# Patient Record
Sex: Male | Born: 1980 | Race: White | Hispanic: No | Marital: Single | Smoking: Current every day smoker
Health system: Southern US, Community
[De-identification: ages and names within clinical notes are randomized; demographics above are authoritative.]

## PROBLEM LIST (undated history)

## (undated) DIAGNOSIS — A6920 Lyme disease, unspecified: Secondary | ICD-10-CM

## (undated) DIAGNOSIS — K8020 Calculus of gallbladder without cholecystitis without obstruction: Secondary | ICD-10-CM

## (undated) DIAGNOSIS — F909 Attention-deficit hyperactivity disorder, unspecified type: Secondary | ICD-10-CM

## (undated) DIAGNOSIS — F1910 Other psychoactive substance abuse, uncomplicated: Secondary | ICD-10-CM

---

## 2018-02-23 ENCOUNTER — Encounter (HOSPITAL_COMMUNITY): Payer: Self-pay | Admitting: Emergency Medicine

## 2018-02-23 ENCOUNTER — Emergency Department (HOSPITAL_COMMUNITY): Payer: Self-pay

## 2018-02-23 ENCOUNTER — Other Ambulatory Visit: Payer: Self-pay

## 2018-02-23 ENCOUNTER — Emergency Department (HOSPITAL_COMMUNITY)
Admission: EM | Admit: 2018-02-23 | Discharge: 2018-02-23 | Disposition: A | Discharge status: Home / Self Care | Payer: Self-pay | Attending: Emergency Medicine | Admitting: Emergency Medicine

## 2018-02-23 DIAGNOSIS — F17210 Nicotine dependence, cigarettes, uncomplicated: Secondary | ICD-10-CM | POA: Insufficient documentation

## 2018-02-23 DIAGNOSIS — N201 Calculus of ureter: Secondary | ICD-10-CM | POA: Insufficient documentation

## 2018-02-23 DIAGNOSIS — Z79899 Other long term (current) drug therapy: Secondary | ICD-10-CM | POA: Insufficient documentation

## 2018-02-23 DIAGNOSIS — M545 Low back pain: Secondary | ICD-10-CM | POA: Insufficient documentation

## 2018-02-23 DIAGNOSIS — R112 Nausea with vomiting, unspecified: Secondary | ICD-10-CM | POA: Insufficient documentation

## 2018-02-23 HISTORY — DX: Lyme disease, unspecified: A69.20

## 2018-02-23 HISTORY — DX: Calculus of gallbladder without cholecystitis without obstruction: K80.20

## 2018-02-23 HISTORY — DX: Other psychoactive substance abuse, uncomplicated: F19.10

## 2018-02-23 HISTORY — DX: Attention-deficit hyperactivity disorder, unspecified type: F90.9

## 2018-02-23 LAB — COMPREHENSIVE METABOLIC PANEL
ALBUMIN: 4 g/dL (ref 3.5–5.0)
ALK PHOS: 84 U/L (ref 38–126)
ALT: 40 U/L (ref 0–44)
AST: 25 U/L (ref 15–41)
Anion gap: 9 (ref 5–15)
BILIRUBIN TOTAL: 0.3 mg/dL (ref 0.3–1.2)
BUN: 14 mg/dL (ref 6–20)
CHLORIDE: 105 mmol/L (ref 98–111)
CO2: 28 mmol/L (ref 22–32)
CREATININE: 1.09 mg/dL (ref 0.61–1.24)
Calcium: 8.7 mg/dL — ABNORMAL LOW (ref 8.9–10.3)
GFR calc Af Amer: >60 mL/min (ref >60)
GLUCOSE: 109 mg/dL — AB (ref 70–99)
Potassium: 3.8 mmol/L (ref 3.5–5.1)
SODIUM: 142 mmol/L (ref 135–145)
Total Protein: 7.4 g/dL (ref 6.5–8.1)

## 2018-02-23 LAB — CBC
HEMATOCRIT: 43.8 % (ref 39.0–52.0)
Hemoglobin: 14.7 g/dL (ref 13.0–17.0)
MCH: 28.9 pg (ref 26.0–34.0)
MCHC: 33.6 g/dL (ref 30.0–36.0)
MCV: 86.1 fL (ref 78.0–100.0)
PLATELETS: 304 10*3/uL (ref 150–400)
RBC: 5.09 MIL/uL (ref 4.22–5.81)
RDW: 13.5 % (ref 11.5–15.5)
WBC: 10.9 10*3/uL — ABNORMAL HIGH (ref 4.0–10.5)

## 2018-02-23 LAB — URINALYSIS, ROUTINE W REFLEX MICROSCOPIC
BACTERIA UA: NONE SEEN
Bilirubin Urine: NEGATIVE
Glucose, UA: NEGATIVE mg/dL
KETONES UR: NEGATIVE mg/dL
Leukocytes, UA: NEGATIVE
Nitrite: NEGATIVE
PH: 5 (ref 5.0–8.0)
Protein, ur: NEGATIVE mg/dL
SPECIFIC GRAVITY, URINE: 1.017 (ref 1.005–1.030)

## 2018-02-23 LAB — LIPASE, BLOOD: Lipase: 37 U/L (ref 11–51)

## 2018-02-23 MED ORDER — IBUPROFEN 800 MG PO TABS: 800.0000 mg | ORAL_TABLET | Freq: Three times a day (TID) | ORAL | 0 refills | Status: AC | PRN

## 2018-02-23 MED ORDER — ONDANSETRON HCL 4 MG PO TABS: 4.0000 mg | ORAL_TABLET | Freq: Four times a day (QID) | ORAL | 0 refills | Status: AC

## 2018-02-23 MED ORDER — TAMSULOSIN HCL 0.4 MG PO CAPS: 0.4000 mg | ORAL_CAPSULE | Freq: Every day | ORAL | 0 refills | Status: AC

## 2018-02-23 NOTE — ED Notes (Signed)
Patient transported to CT 

## 2018-02-23 NOTE — Discharge Instructions (Addendum)
Take Motrin as prescribed as needed for pain.  Take Zofran as needed as prescribed for nausea and vomiting.  Follow-up with urology, return to ER for worsening or concerning symptoms.  Strain all urine.

## 2018-02-23 NOTE — ED Provider Notes (Signed)
[facility]-EMERGENCY DEPT Provider Note   CSN: [id number] Arrival date & time: 02/23/18  1818     History   Chief Complaint Chief Complaint  Patient presents with  . Flank Pain  . Abdominal Pain    HPI Miguel Sanchez is a 37 y.o. male.  37 year old male brought in by EMS for right lower quadrant and flank pain.  Patient states proximal me 20 minutes prior to arrival he developed sudden onset of stabbing pain in his right lower back through to his right lower quadrant area.  Patient reports nausea and vomiting with the onset of his pain.  States the past 2 weeks he had darker than usual urine otherwise denies dysuria or frequency, changes in bowel habits.  No history of similar pain previously.  Pain has improved although still present at this time.  No other complaints or concerns.     Past Medical History:  Diagnosis Date  . ADHD   . Gall stones   . Lyme disease   . Substance abuse (HCC)     There are no active problems to display for this patient.   History reviewed. No pertinent surgical history.      Home Medications    Prior to Admission medications   Medication Sig Start Date End Date Taking? Authorizing Provider  estradiol (ESTRACE) 2 MG tablet Take 2 mg by mouth 3 (three) times daily.   Yes [provider]  ibuprofen (ADVIL,MOTRIN) 800 MG tablet Take 1 tablet (800 mg total) by mouth every 8 (eight) hours as needed for up to 30 doses for moderate pain. 02/23/18   Jeannie Fend, PA-C  ondansetron (ZOFRAN) 4 MG tablet Take 1 tablet (4 mg total) by mouth every 6 (six) hours. 02/23/18   Jeannie Fend, PA-C  tamsulosin (FLOMAX) 0.4 MG CAPS capsule Take 1 capsule (0.4 mg total) by mouth daily for 10 days. 02/23/18 03/05/18  Jeannie Fend, PA-C    Family History No family history on file.  Social History Social History   Tobacco Use  . Smoking status: Current Every Day Smoker    Types: Cigarettes  . Smokeless tobacco: Never Used    Substance Use Topics  . Alcohol use: Yes  . Drug use: Not on file     Allergies   Sulfa antibiotics and Doxycycline   Review of Systems Review of Systems  Constitutional: Negative for chills and fever.  Respiratory: Negative for shortness of breath.   Cardiovascular: Negative for chest pain.  Gastrointestinal: Positive for abdominal pain, nausea and vomiting. Negative for constipation and diarrhea.  Genitourinary: Negative for difficulty urinating, scrotal swelling and testicular pain.  Musculoskeletal: Positive for back pain.  Skin: Negative for rash and wound.  Allergic/Immunologic: Negative for immunocompromised state.  Hematological: Does not bruise/bleed easily.  Psychiatric/Behavioral: Negative for confusion.  All other systems reviewed and are negative.    Physical Exam Updated Vital Signs BP 109/62 (BP Location: Left Arm)   Pulse 76   Temp 97.9 F (36.6 C) (Oral)   Resp 17   Ht 6\' 2"  (1.88 m)   Wt 74.8 kg   SpO2 99%   BMI 21.18 kg/m   Physical Exam  Constitutional: He is oriented to person, place, and time. He appears well-developed and well-nourished.  Non-toxic appearance. He does not appear ill. No distress.  HENT:  Head: Normocephalic and atraumatic.  Cardiovascular: Normal rate, regular rhythm and normal heart sounds.  Pulmonary/Chest: Effort normal and breath sounds normal.  Abdominal:  Normal appearance. There is generalized tenderness and tenderness in the right upper quadrant and right lower quadrant. There is no CVA tenderness.  Neurological: He is alert and oriented to person, place, and time.  Skin: Skin is warm and dry. He is not diaphoretic.  Psychiatric: He has a normal mood and affect. His behavior is normal.  Nursing note and vitals reviewed.    ED Treatments / Results  Labs (all labs ordered are listed, but only abnormal results are displayed) Labs Reviewed  COMPREHENSIVE METABOLIC PANEL - Abnormal; Notable for the following  components:      Result Value   Glucose, Bld 109 (*)    Calcium 8.7 (*)    All other components within normal limits  CBC - Abnormal; Notable for the following components:   WBC 10.9 (*)    All other components within normal limits  URINALYSIS, ROUTINE W REFLEX MICROSCOPIC - Abnormal; Notable for the following components:   Hgb urine dipstick SMALL (*)    All other components within normal limits  LIPASE, BLOOD    EKG None  Radiology Ct Renal Stone Study  Result Date: 02/23/2018 CLINICAL DATA:  Flank pain EXAM: CT ABDOMEN AND PELVIS WITHOUT CONTRAST TECHNIQUE: Multidetector CT imaging of the abdomen and pelvis was performed following the standard protocol without IV contrast. COMPARISON:  None. FINDINGS: Lower chest: Lung bases are clear. No effusions. Heart is normal size. Hepatobiliary: Mild fatty infiltration of the liver. No focal hepatic abnormality. Gallbladder unremarkable. Pancreas: No focal abnormality or ductal dilatation. Spleen: No focal abnormality.  Normal size. Adrenals/Urinary Tract: Bilateral nonobstructing renal stones. Mild right hydronephrosis. 5 mm distal right ureteral stone. Adrenal glands and urinary bladder unremarkable. Stomach/Bowel: Appendix is normal. Stomach, large and small bowel grossly unremarkable. Vascular/Lymphatic: No evidence of aneurysm or adenopathy. Reproductive: No visible focal abnormality. Other: No free fluid or free air. Musculoskeletal: No acute bony abnormality. IMPRESSION: 5 mm right UVJ stone with mild right hydronephrosis. Bilateral punctate nephrolithiasis. Mild fatty infiltration of the liver. Electronically Signed   By: Charlett NoseKevin  Dover M.D.   On: 02/23/2018 20:09    Procedures Procedures (including critical care time)  Medications Ordered in ED Medications - No data to display   Initial Impression / Assessment and Plan / ED Course  I have reviewed the triage vital signs and the nursing notes.  Pertinent labs & imaging results that  were available during my care of the patient were reviewed by me and considered in my medical decision making (see chart for details).  Clinical Course as of Feb 23 2042  Thu Feb 23, 2018  [id number] 37 year old male presents with complaint of sudden onset right low back pain with nausea and vomiting.  On exam no CVA tenderness, mild tenderness in the right lower quadrant.  Review of lab work patient is a small amount of hemoglobin in his urine, CMP unremarkable, lipase normal, CBC with mild leukocytosis. CT shows 5mm right UVJ stone. Patient is comfortable without further pain medication, ready for dc. Declines narcotic pain medications, given zofran, motrin and flomax. Follow up with urology, return to ER as needed, strain all urine.    [LM]    Clinical Course User Index [LM] Jeannie FendMurphy, Rashena Dowling A, PA-C     Final Clinical Impressions(s) / ED Diagnoses   Final diagnoses:  Right ureteral stone    ED Discharge Orders         Ordered    ondansetron (ZOFRAN) 4 MG tablet  Every 6 hours  02/23/18 2032    tamsulosin (FLOMAX) 0.4 MG CAPS capsule  Daily     02/23/18 2032    ibuprofen (ADVIL,MOTRIN) 800 MG tablet  Every 8 hours PRN     02/23/18 2032           Jeannie Fend, PA-C 02/23/18 2043    Samuel Jester, DO 02/23/18 2302

## 2018-02-23 NOTE — ED Notes (Signed)
Bed: WA14 Expected date:  Expected time:  Means of arrival:  Comments: EMS/abd. pain 

## 2018-02-23 NOTE — ED Notes (Signed)
Provided Pt with strainer before departure

## 2018-02-23 NOTE — ED Notes (Addendum)
Sent urine culture to lab with urine sample

## 2018-02-23 NOTE — ED Triage Notes (Signed)
Per [facility] pt had sudden severe right flank pain that radiates to right abd. Pt reports feels like needs to urinate but was painful. Pt diaphoretic with EMS in route.  Reports had darker red urine 2 weeks ago but none today.  Pt request to not have narcotics.  Vitals: 154/90 100HR, 26R, CBG 84.

## 2018-02-23 NOTE — ED Notes (Signed)
Pt given urinal.

## 2018-02-23 NOTE — ED Notes (Signed)
ED Provider at bedside. 

## 2018-02-25 ENCOUNTER — Encounter (HOSPITAL_COMMUNITY): Payer: Self-pay

## 2018-02-25 ENCOUNTER — Other Ambulatory Visit: Payer: Self-pay

## 2018-02-25 ENCOUNTER — Emergency Department (HOSPITAL_COMMUNITY)
Admission: EM | Admit: 2018-02-25 | Discharge: 2018-02-25 | Disposition: A | Discharge status: Home / Self Care | Payer: Self-pay | Attending: Emergency Medicine | Admitting: Emergency Medicine

## 2018-02-25 DIAGNOSIS — N200 Calculus of kidney: Secondary | ICD-10-CM | POA: Insufficient documentation

## 2018-02-25 DIAGNOSIS — Z79899 Other long term (current) drug therapy: Secondary | ICD-10-CM | POA: Insufficient documentation

## 2018-02-25 DIAGNOSIS — F17210 Nicotine dependence, cigarettes, uncomplicated: Secondary | ICD-10-CM | POA: Insufficient documentation

## 2018-02-25 MED ORDER — HYDROMORPHONE HCL 2 MG PO TABS: 2.0000 mg | ORAL_TABLET | ORAL | Status: DC | PRN

## 2018-02-25 MED ORDER — HYDROMORPHONE HCL 1 MG/ML IJ SOLN
1.0000 mg | Freq: Once | INTRAMUSCULAR | Status: AC
  Administered 2018-02-25: 1 mg via INTRAVENOUS
  Filled 2018-02-25: qty 1

## 2018-02-25 NOTE — ED Triage Notes (Signed)
Pt states that they were recently dx with kidney stones. Pt states they took oxy 10 mg at 1130. Pt having right flank pain.

## 2018-02-25 NOTE — ED Provider Notes (Addendum)
[facility]-EMERGENCY DEPT Provider Note  CSN: [id number] Arrival date & time: 02/25/18  1334  History   Chief Complaint Chief Complaint  Patient presents with  . Flank Pain    HPI Miguel Sanchez (prefers to go by Miguel Sanchez) is a 37 y.o. transitioning male to male with a medical history of nephrolithiasis who presented to the ED for pain associated with nephrolithiasis. Patient states that she was diagnosed with right kidney stone at his ED visit on 02/23/18. Patient reports that pain has been manageable with ibuprofen and has been compliant with Flomax. However, she states that this morning that the pain acutely worsened and has not alleviated at all. Currently endorses 10/10 right flank pain that radiates to his testicles. Associated symptoms: sweating, nausea and vomiting. Denies fever, chills, abdominal pain, hematuria, dysuria, frequency or urgency.  Additional history obtained from medical chart. CT renal stone study on 02/23/18 showed a 5mm right nephrolithiasis in the UVJ.  Past Medical History:  Diagnosis Date  . ADHD   . Gall stones   . Lyme disease   . Substance abuse (HCC)     There are no active problems to display for this patient.   History reviewed. No pertinent surgical history.    Home Medications    Prior to Admission medications   Medication Sig Start Date End Date Taking? Authorizing Provider  estradiol (ESTRACE) 2 MG tablet Take 2 mg by mouth 3 (three) times daily.   Yes [provider]  HYDROcodone-acetaminophen (NORCO) 10-325 MG tablet Take 1 tablet by mouth every 6 (six) hours as needed for moderate pain or severe pain.   Yes [provider]  tamsulosin (FLOMAX) 0.4 MG CAPS capsule Take 1 capsule (0.4 mg total) by mouth daily for 10 days. 02/23/18 03/05/18 Yes Jeannie Fend, PA-C  ibuprofen (ADVIL,MOTRIN) 800 MG tablet Take 1 tablet (800 mg total) by mouth every 8 (eight) hours as needed for up to 30 doses for moderate  pain. 02/23/18   Jeannie Fend, PA-C  ondansetron (ZOFRAN) 4 MG tablet Take 1 tablet (4 mg total) by mouth every 6 (six) hours. 02/23/18   Jeannie Fend, PA-C    Family History No family history on file.  Social History Social History   Tobacco Use  . Smoking status: Current Every Day Smoker    Packs/day: 0.50    Types: Cigarettes  . Smokeless tobacco: Never Used  Substance Use Topics  . Alcohol use: Yes    Comment: sparsely  . Drug use: Yes    Types: Marijuana     Allergies   Sulfa antibiotics and Doxycycline   Review of Systems Review of Systems  Constitutional: Positive for diaphoresis. Negative for chills and fever.  Gastrointestinal: Positive for nausea and vomiting. Negative for abdominal pain, constipation and diarrhea.  Genitourinary: Positive for flank pain and testicular pain. Negative for dysuria and hematuria.  Skin: Negative.   Neurological: Negative.    Physical Exam Updated Vital Signs BP 104/65   Pulse 78   Temp (!) 97.4 F (36.3 C) (Oral)   Resp 17   Ht 6\' 2"  (1.88 m)   Wt 77.1 kg   SpO2 95%   BMI 21.83 kg/m   Physical Exam  Constitutional:  Leaning over the hospital bed in pain. Shouting in pain and sweating.  Cardiovascular: Normal rate, regular rhythm and normal heart sounds.  Pulmonary/Chest: Effort normal and breath sounds normal.  Abdominal: Soft. Bowel sounds are normal. There is no tenderness. There  is CVA tenderness.  Musculoskeletal: Normal range of motion.  Skin: Skin is warm. Capillary refill takes less than 2 seconds. He is diaphoretic. No erythema.  Nursing note and vitals reviewed.  ED Treatments / Results  Labs (all labs ordered are listed, but only abnormal results are displayed) Labs Reviewed  URINALYSIS, ROUTINE W REFLEX MICROSCOPIC    EKG None  Radiology Ct Renal Stone Study  Result Date: 02/23/2018 CLINICAL DATA:  Flank pain EXAM: CT ABDOMEN AND PELVIS WITHOUT CONTRAST TECHNIQUE: Multidetector CT imaging of  the abdomen and pelvis was performed following the standard protocol without IV contrast. COMPARISON:  None. FINDINGS: Lower chest: Lung bases are clear. No effusions. Heart is normal size. Hepatobiliary: Mild fatty infiltration of the liver. No focal hepatic abnormality. Gallbladder unremarkable. Pancreas: No focal abnormality or ductal dilatation. Spleen: No focal abnormality.  Normal size. Adrenals/Urinary Tract: Bilateral nonobstructing renal stones. Mild right hydronephrosis. 5 mm distal right ureteral stone. Adrenal glands and urinary bladder unremarkable. Stomach/Bowel: Appendix is normal. Stomach, large and small bowel grossly unremarkable. Vascular/Lymphatic: No evidence of aneurysm or adenopathy. Reproductive: No visible focal abnormality. Other: No free fluid or free air. Musculoskeletal: No acute bony abnormality. IMPRESSION: 5 mm right UVJ stone with mild right hydronephrosis. Bilateral punctate nephrolithiasis. Mild fatty infiltration of the liver. Electronically Signed   By: Charlett Nose M.D.   On: 02/23/2018 20:09    Procedures Procedures (including critical care time)  Medications Ordered in ED Medications  HYDROmorphone (DILAUDID) tablet 2 mg (has no administration in time range)  HYDROmorphone (DILAUDID) injection 1 mg (1 mg Intravenous Given 02/25/18 1428)  HYDROmorphone (DILAUDID) injection 1 mg (1 mg Intravenous Given 02/25/18 1521)     Initial Impression / Assessment and Plan / ED Course  Triage vital signs and the nursing notes have been reviewed.  Pertinent labs & imaging results that were available during care of the patient were reviewed and considered in medical decision making (see chart for details).  Patient presents to the ED with severe right flank pain. She is found leaning horizontally over the bed and is sweating profusely due to pain and states that position is the only comfortable position for him. Patient has no other complaints to suggest an acute or emergent  intra-abdominal or GU process. Patient seen by urology yesterday 02/24/18 and was prescribed Oxycodone for pain control which did not help with the acute pain today due to passing of nephrolithiasis. Will administer IV pain medications for acute pain control.  Clinical Course as of Feb 25 1713  Sat Feb 25, 2018  1624 Patient re-evaluated after IV Dilaudid 2mg  given and patient reports significant relief. Repeat physical exam is grossly unremarkable except for right CVA tenderness which is expected given diagnosis.   [GM]    Clinical Course User Index [GM] Bryttany Tortorelli, Sharyon Medicus, PA-C    Final Clinical Impressions(s) / ED Diagnoses  1. Nephrolithiasis. Education provided on pharmacologic and supportive treatment for pain relief. Advised to follow-up with urology as scheduled and continue taking Flomax.  Dispo: Home. After thorough clinical evaluation, this patient is determined to be medically stable and can be safely discharged with the previously mentioned treatment and/or outpatient follow-up/referral(s). At this time, there are no other apparent medical conditions that require further screening, evaluation or treatment.  Final diagnoses:  Nephrolithiasis    ED Discharge Orders    None        Windy Carina, PA-C 02/25/18 [address] [place], [place] 02/25/18 1714  Wynetta FinesMessick, Peter C, MD 02/27/18 [phone]

## 2018-02-25 NOTE — Discharge Instructions (Addendum)
I hope the worst of the pain is over for you.   Continue to take the Flomax as prescribed. You may take Tylenol (up to 650mg ) with the Oxycodone prescribed by urology. To stay ahead of the pain, I would at least take the Tylenol or Ibuprofen on a schedule for the next 2-3 days. Be sure to strain your urine every time you go to the bathroom to check to see if the kidney stone is passed and so you can take it to urology.  Thank you for allowing me to take care of you today!  I have provided you with more information on diet changes you can make. Thank you for allowing me to take care of you today!

## 2018-02-25 NOTE — ED Notes (Signed)
Pt goes by [employer] Marcine Sanchez([name])

## 2018-03-03 ENCOUNTER — Emergency Department (HOSPITAL_COMMUNITY)
Admission: EM | Admit: 2018-03-03 | Discharge: 2018-03-03 | Disposition: A | Discharge status: Home / Self Care | Payer: Self-pay | Attending: Emergency Medicine | Admitting: Emergency Medicine

## 2018-03-03 ENCOUNTER — Emergency Department (HOSPITAL_COMMUNITY): Payer: Self-pay

## 2018-03-03 ENCOUNTER — Other Ambulatory Visit: Payer: Self-pay

## 2018-03-03 ENCOUNTER — Encounter (HOSPITAL_COMMUNITY): Payer: Self-pay | Admitting: Emergency Medicine

## 2018-03-03 DIAGNOSIS — N134 Hydroureter: Secondary | ICD-10-CM | POA: Insufficient documentation

## 2018-03-03 DIAGNOSIS — F17210 Nicotine dependence, cigarettes, uncomplicated: Secondary | ICD-10-CM | POA: Insufficient documentation

## 2018-03-03 DIAGNOSIS — R109 Unspecified abdominal pain: Secondary | ICD-10-CM

## 2018-03-03 DIAGNOSIS — N201 Calculus of ureter: Secondary | ICD-10-CM

## 2018-03-03 DIAGNOSIS — F909 Attention-deficit hyperactivity disorder, unspecified type: Secondary | ICD-10-CM | POA: Insufficient documentation

## 2018-03-03 DIAGNOSIS — Z79899 Other long term (current) drug therapy: Secondary | ICD-10-CM | POA: Insufficient documentation

## 2018-03-03 DIAGNOSIS — F1290 Cannabis use, unspecified, uncomplicated: Secondary | ICD-10-CM | POA: Insufficient documentation

## 2018-03-03 DIAGNOSIS — N132 Hydronephrosis with renal and ureteral calculous obstruction: Secondary | ICD-10-CM | POA: Insufficient documentation

## 2018-03-03 DIAGNOSIS — R112 Nausea with vomiting, unspecified: Secondary | ICD-10-CM | POA: Insufficient documentation

## 2018-03-03 LAB — URINALYSIS, ROUTINE W REFLEX MICROSCOPIC
Bilirubin Urine: NEGATIVE
GLUCOSE, UA: NEGATIVE mg/dL
Ketones, ur: 5 mg/dL — AB
Leukocytes, UA: NEGATIVE
NITRITE: NEGATIVE
PH: 5 (ref 5.0–8.0)
Protein, ur: 30 mg/dL — AB
Specific Gravity, Urine: 1.015 (ref 1.005–1.030)

## 2018-03-03 LAB — BASIC METABOLIC PANEL
Anion gap: 13 (ref 5–15)
BUN: 20 mg/dL (ref 6–20)
CO2: 26 mmol/L (ref 22–32)
CREATININE: 1.84 mg/dL — AB (ref 0.61–1.24)
Calcium: 9 mg/dL (ref 8.9–10.3)
Chloride: 100 mmol/L (ref 98–111)
GFR, EST AFRICAN AMERICAN: 53 mL/min — AB (ref >60)
GFR, EST NON AFRICAN AMERICAN: 46 mL/min — AB (ref >60)
Glucose, Bld: 103 mg/dL — ABNORMAL HIGH (ref 70–99)
Potassium: 3.9 mmol/L (ref 3.5–5.1)
SODIUM: 139 mmol/L (ref 135–145)

## 2018-03-03 LAB — CBC WITH DIFFERENTIAL/PLATELET
Basophils Absolute: 0.1 10*3/uL (ref 0.0–0.1)
Basophils Relative: 0 %
EOS ABS: 0.4 10*3/uL (ref 0.0–0.7)
EOS PCT: 3 %
HCT: 40.5 % (ref 39.0–52.0)
Hemoglobin: 13.8 g/dL (ref 13.0–17.0)
Lymphocytes Relative: 18 %
Lymphs Abs: 2.4 10*3/uL (ref 0.7–4.0)
MCH: 29.5 pg (ref 26.0–34.0)
MCHC: 34.1 g/dL (ref 30.0–36.0)
MCV: 86.5 fL (ref 78.0–100.0)
MONO ABS: 1.1 10*3/uL — AB (ref 0.1–1.0)
MONOS PCT: 8 %
Neutro Abs: 9.8 10*3/uL — ABNORMAL HIGH (ref 1.7–7.7)
Neutrophils Relative %: 71 %
PLATELETS: 292 10*3/uL (ref 150–400)
RBC: 4.68 MIL/uL (ref 4.22–5.81)
RDW: 13.6 % (ref 11.5–15.5)
WBC: 13.8 10*3/uL — ABNORMAL HIGH (ref 4.0–10.5)

## 2018-03-03 MED ORDER — OXYCODONE-ACETAMINOPHEN 5-325 MG PO TABS: 1.0000 | ORAL_TABLET | ORAL | 0 refills | Status: AC | PRN

## 2018-03-03 MED ORDER — HYDROMORPHONE HCL 1 MG/ML IJ SOLN
1.0000 mg | Freq: Once | INTRAMUSCULAR | Status: AC
  Administered 2018-03-03: 1 mg via INTRAVENOUS
  Filled 2018-03-03: qty 1

## 2018-03-03 MED ORDER — SODIUM CHLORIDE 0.9 % IV BOLUS
500.0000 mL | Freq: Once | INTRAVENOUS | Status: AC
  Administered 2018-03-03: 500 mL via INTRAVENOUS

## 2018-03-03 MED ORDER — ONDANSETRON HCL 4 MG/2ML IJ SOLN
4.0000 mg | Freq: Once | INTRAMUSCULAR | Status: AC
  Administered 2018-03-03: 4 mg via INTRAVENOUS
  Filled 2018-03-03: qty 2

## 2018-03-03 MED ORDER — IBUPROFEN 200 MG PO TABS
600.0000 mg | ORAL_TABLET | Freq: Once | ORAL | Status: AC
  Administered 2018-03-03: 600 mg via ORAL
  Filled 2018-03-03: qty 3

## 2018-03-03 NOTE — ED Notes (Signed)
Pt states preferred name is "Miguel Sanchez" (pronounced Caylin)

## 2018-03-03 NOTE — Discharge Instructions (Addendum)
Continue your medications as prescribed. Follow-up with Dr. Vernie Ammons next week as scheduled. Return here for any new/acute changes-- uncontrolled pain, fevers, inability to urinate, etc.

## 2018-03-03 NOTE — ED Notes (Signed)
Patient given ginger ale for po challenge, per provider.

## 2018-03-03 NOTE — ED Triage Notes (Signed)
Pt arriving with right sided flank pain that began around 2pm. Pt reports taking oxycodone, tylenol, and motrin today with little relief. Pt also reports throwing up small amount of blood. Pt has hx of kidney stones. Pt also reporting dizziness, N/V, and chills.

## 2018-03-03 NOTE — ED Provider Notes (Signed)
Pt signed out to me by L Sanders PA-C. She is a M-F who presents with right uretal stone. At shift change UA and PO challenge are pending as well as IVF. CBC is remarkable for leukocytosis. BMP is remarkable for AKI. Repeat CT renal shows stone at the right UVJ.  UA is not consistent with infection. Rechecked pt. She is still having a lot of pain and nausea. Another round of pain medicine and nausea medicine were ordered  Rechecked pt again. She is feeling overall better but still has pain. She tolerated PO. She is almost out of Percocet. Will refill and have her f/u with Urology.   Bethel Born, PA-C 03/03/18 1531    Loren Racer, MD 03/10/18 [phone]

## 2018-03-03 NOTE — ED Provider Notes (Signed)
[facility]-EMERGENCY DEPT Provider Note   CSN: [id number] Arrival date & time: 03/03/18  0330     History   Chief Complaint Chief Complaint  Patient presents with  . Flank Pain    right    HPI Miguel Sanchez is a 37 y.o. male.  The history is provided by the patient and medical records.     37 year old male with history of ADHD, Lyme disease, polysubstance abuse, presenting to the ED with right flank pain.  He was seen in the ED on 02/23/2018 initially and had CT renal study performed with evidence of 5 mm right ureteral calculus as well as several nonobstructing renal calculi.  Seen in the ED again on 02/25/2018 for intractable pain, feeling better upon discharge.  He has since followed up with urology, Dr. Vernie Ammons, recommended continued good oral hydration and monitoring for passage of stone.  Patient denies seeing any stones pass recently, mostly came in today because of nausea and vomiting.  States it got fairly significant around 5 PM.  Last emesis consisted of very hard retching and he did notice some streaks of blood in his emesis.  He denies any difficulty urinating or dysuria.  Does have some continued pain in the right flank, states it comes in waves.   Currently taking oxycodone, zofran, and flomax for symptomatic control.   Past Medical History:  Diagnosis Date  . ADHD   . Gall stones   . Lyme disease   . Substance abuse (HCC)     There are no active problems to display for this patient.   No past surgical history on file.      Home Medications    Prior to Admission medications   Medication Sig Start Date End Date Taking? Authorizing Provider  estradiol (ESTRACE) 2 MG tablet Take 2 mg by mouth 3 (three) times daily.    [provider]  HYDROcodone-acetaminophen (NORCO) 10-325 MG tablet Take 1 tablet by mouth every 6 (six) hours as needed for moderate pain or severe pain.    [provider]  ibuprofen (ADVIL,MOTRIN) 800 MG  tablet Take 1 tablet (800 mg total) by mouth every 8 (eight) hours as needed for up to 30 doses for moderate pain. 02/23/18   Jeannie Fend, PA-C  ondansetron (ZOFRAN) 4 MG tablet Take 1 tablet (4 mg total) by mouth every 6 (six) hours. 02/23/18   Jeannie Fend, PA-C  tamsulosin (FLOMAX) 0.4 MG CAPS capsule Take 1 capsule (0.4 mg total) by mouth daily for 10 days. 02/23/18 03/05/18  Jeannie Fend, PA-C    Family History No family history on file.  Social History Social History   Tobacco Use  . Smoking status: Current Every Day Smoker    Packs/day: 0.50    Types: Cigarettes  . Smokeless tobacco: Never Used  Substance Use Topics  . Alcohol use: Yes    Comment: sparsely  . Drug use: Yes    Types: Marijuana     Allergies   Sulfa antibiotics and Doxycycline   Review of Systems Review of Systems  Gastrointestinal: Positive for nausea and vomiting.  Genitourinary: Positive for flank pain.  All other systems reviewed and are negative.    Physical Exam Updated Vital Signs BP 117/89 (BP Location: Left Arm)   Pulse (!) 103   Temp 98.1 F (36.7 C) (Oral)   SpO2 97%   Physical Exam  Constitutional: He is oriented to person, place, and time. He appears well-developed and well-nourished.  Appears uncomfortable  HENT:  Head: Normocephalic and atraumatic.  Mouth/Throat: Oropharynx is clear and moist.  Eyes: Pupils are equal, round, and reactive to light. Conjunctivae and EOM are normal.  Neck: Normal range of motion.  Cardiovascular: Normal rate, regular rhythm and normal heart sounds.  Pulmonary/Chest: Effort normal and breath sounds normal. No stridor. No respiratory distress.  Abdominal: Soft. Bowel sounds are normal. There is no tenderness. There is no rebound.  Musculoskeletal: Normal range of motion.  Neurological: He is alert and oriented to person, place, and time.  Skin: Skin is warm and dry.  Psychiatric: He has a normal mood and affect.  Nursing note and vitals  reviewed.    ED Treatments / Results  Labs (all labs ordered are listed, but only abnormal results are displayed) Labs Reviewed  CBC WITH DIFFERENTIAL/PLATELET - Abnormal; Notable for the following components:      Result Value   WBC 13.8 (*)    Neutro Abs 9.8 (*)    Monocytes Absolute 1.1 (*)    All other components within normal limits  BASIC METABOLIC PANEL - Abnormal; Notable for the following components:   Glucose, Bld 103 (*)    Creatinine, Ser 1.84 (*)    GFR calc non Af Amer 46 (*)    GFR calc Af Amer 53 (*)    All other components within normal limits  URINALYSIS, ROUTINE W REFLEX MICROSCOPIC    EKG None  Radiology Ct Renal Stone Study  Result Date: 03/03/2018 CLINICAL DATA:  Right flank pain. Vomiting. Known kidney stones. EXAM: CT ABDOMEN AND PELVIS WITHOUT CONTRAST TECHNIQUE: Multidetector CT imaging of the abdomen and pelvis was performed following the standard protocol without IV contrast. COMPARISON:  02/23/2018 FINDINGS: Lower chest: Atelectasis in the lung bases. Hepatobiliary: No focal liver abnormality is seen. No gallstones, gallbladder wall thickening, or biliary dilatation. Pancreas: Unremarkable. No pancreatic ductal dilatation or surrounding inflammatory changes. Spleen: Normal in size without focal abnormality. Adrenals/Urinary Tract: No adrenal gland nodules. Bilateral intrarenal stones. Right hydronephrosis and hydroureter with stranding around the right kidney and ureter. Stone in the orifice of the right ureterovesical junction has progressed slightly since previous study. Stone measures 5 mm in diameter. No hydronephrosis or hydroureter on the left. Bladder wall is not thickened. Stomach/Bowel: Stomach is within normal limits. Appendix appears normal. No evidence of bowel wall thickening, distention, or inflammatory changes. Vascular/Lymphatic: No significant vascular findings are present. No enlarged abdominal or pelvic lymph nodes. Reproductive: Prostate is  unremarkable. Other: No abdominal wall hernia or abnormality. No abdominopelvic ascites. Musculoskeletal: No acute or significant osseous findings. IMPRESSION: 5 mm stone in the orifice of the right ureterovesical junction with moderate proximal obstruction. Bilateral nonobstructing intrarenal stones. Electronically Signed   By: Burman Nieves M.D.   On: 03/03/2018 06:10    Procedures Procedures (including critical care time)  Medications Ordered in ED Medications - No data to display   Initial Impression / Assessment and Plan / ED Course  I have reviewed the triage vital signs and the nursing notes.  Pertinent labs & imaging results that were available during my care of the patient were reviewed by me and considered in my medical decision making (see chart for details).  37 year old male presenting to the ED with right flank pain.  Seen in the ED on 02/23/2018 and diagnosed with 5 mm right ureteral calculus.  Seen again in the ED on 02/25/2018 for pain control.  Has followed up with Dr. Vernie Ammons in the urology clinic, recommended  continued good oral hydration and monitoring for passage of stone.  Patient has not seen any stones pass this yet.  Reports today he did have a lot of nausea and vomiting, reports some streaks of blood in his emesis.  He is afebrile and nontoxic but does appear uncomfortable.  Will repeat screening labs, pain and nausea medications ordered.  Labs with worsening creatinine, however BUN remains normal.  Suspect some of this may be from dehydration given his N/V, however concern for possible worsening hydronephrosis.  Repeat imaging ordered.  Patient has had some improvement with his symptoms after initial medications.  No further emesis.  Suspect blood in emesis from earlier may be from Mallory-Weiss tear as he does report heavy retching.  Will await repeat renal study.  Renal study with progression of the stone at the orifice of right UVJ, no hydronephrosis noted today.   Patient reports recurrent pain which sounds mostly like renal colic.  Likely would benefit from Toradol but given his worsening creatinine, will hold off on this.  Will give bolus of fluids and repeat pain medication here.  Still awaiting urinalysis.  6:56 AM UA pending.  IVF infusing, second dose of meds given.  Care will be signed out to oncoming provider for re-check of pain and ensure tolerating PO.  Patient aware of progression of his stone, likely pass in the next 24-48 hours and goal will be to discharge home with OP urology follow-up.  Will need re-check of creatinine in the near future.  Case discussed with Dr. Rhunette Croft, agrees with management and plan of care.  Final Clinical Impressions(s) / ED Diagnoses   Final diagnoses:  Flank pain  Ureteral stone    ED Discharge Orders    None       Garlon Hatchet, PA-C 03/03/18 0093    Derwood Kaplan, MD 03/03/18 [id number]    Derwood Kaplan, MD 03/03/18 [phone]

## 2020-07-08 IMAGING — CT CT RENAL STONE PROTOCOL
2 of 4 series · 16 of 46 positions shown, 18 images · non-contrast
Comparison: 02/23/2018

CLINICAL DATA: Right flank pain. Vomiting. Known kidney stones.

EXAM:
CT ABDOMEN AND PELVIS WITHOUT CONTRAST
TECHNIQUE: Multidetector CT imaging of the abdomen and pelvis was performed
following the standard protocol without IV contrast.

[Series 2: axial st · axial · 0.92mm/px · z∈[+1012,+1427]mm · 13 of 93 slices shown, 15 images]
[im 5/93  soft-tissue]
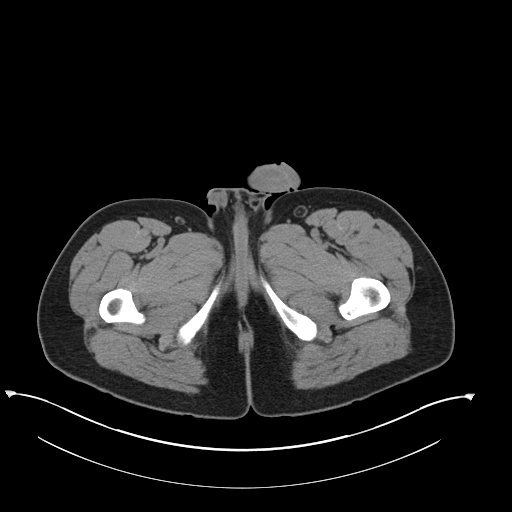
[im 5/93  bone]
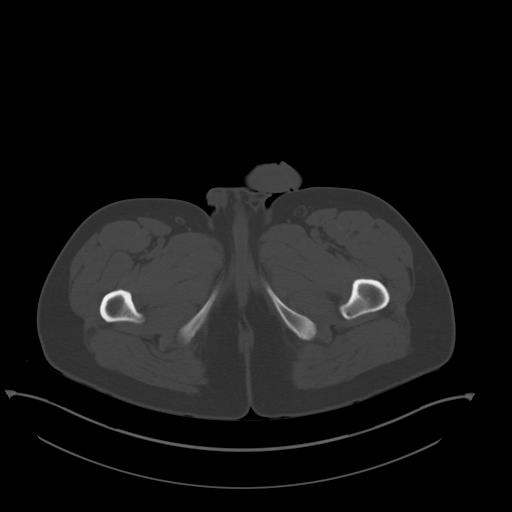
[im 14/93  soft-tissue]
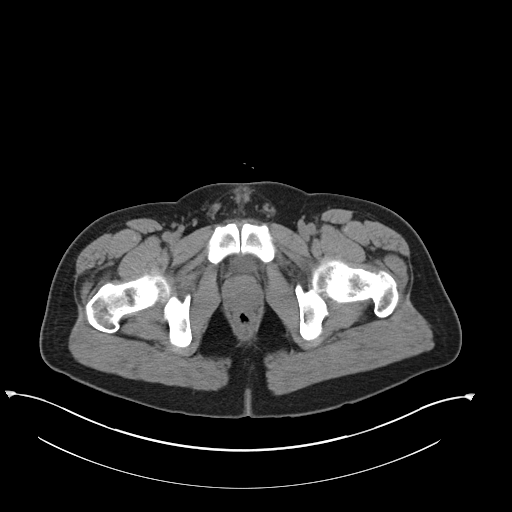
[im 18/93  soft-tissue]
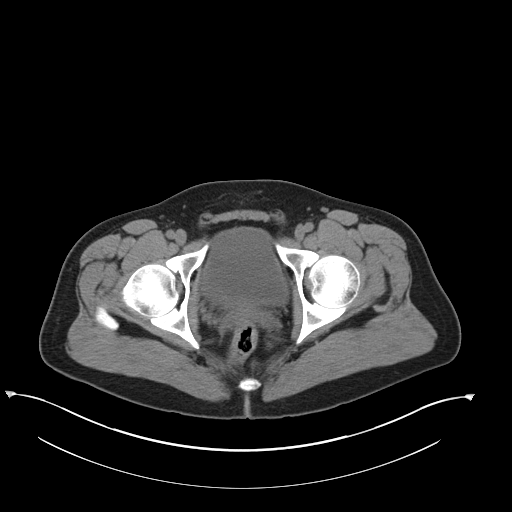
[im 27/93  soft-tissue]
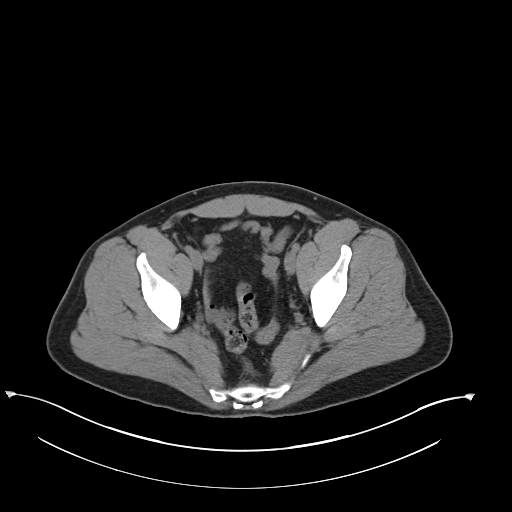
[im 31/93  soft-tissue]
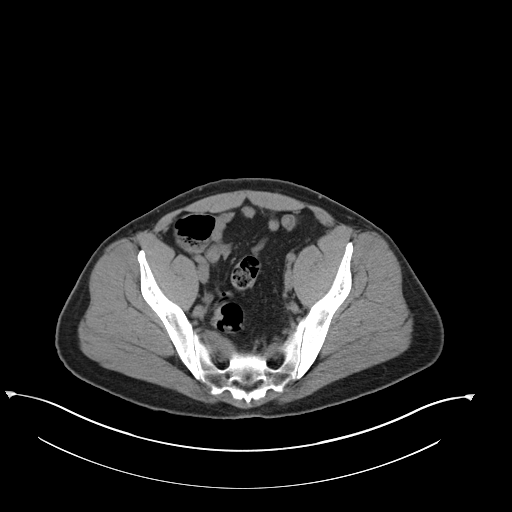
[im 40/93  soft-tissue]
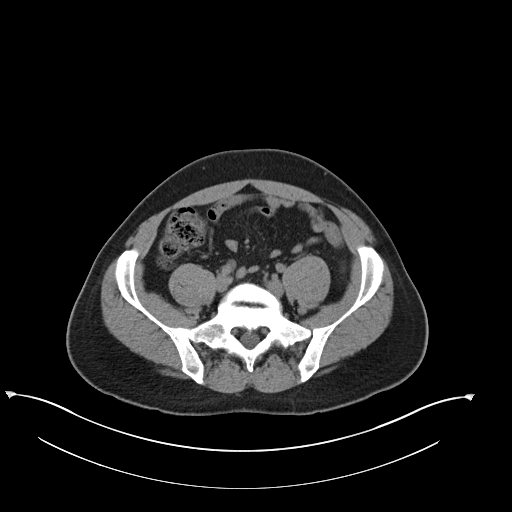
[im 49/93  soft-tissue]
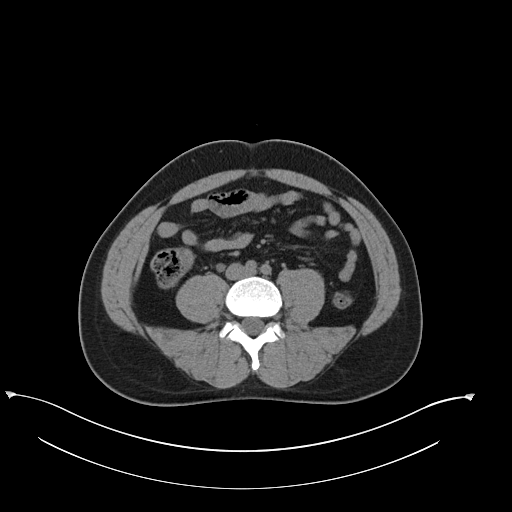
[im 53/93  soft-tissue]
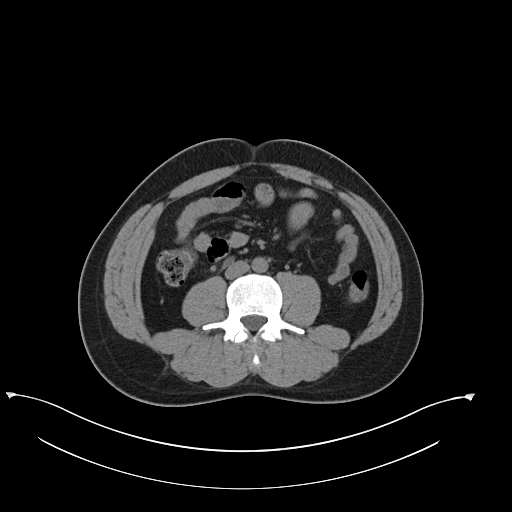
[im 62/93  soft-tissue]
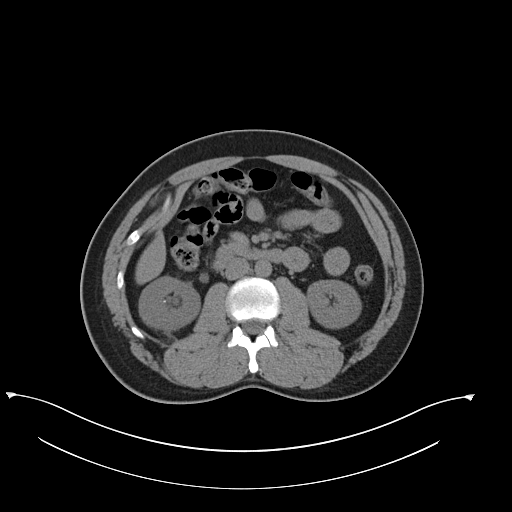
[im 62/93  bone]
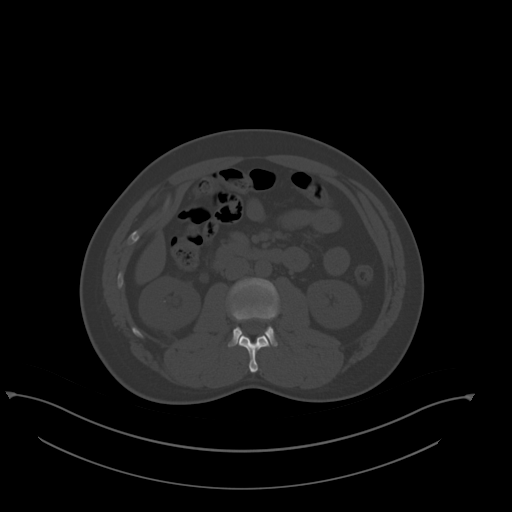
[im 66/93  soft-tissue]
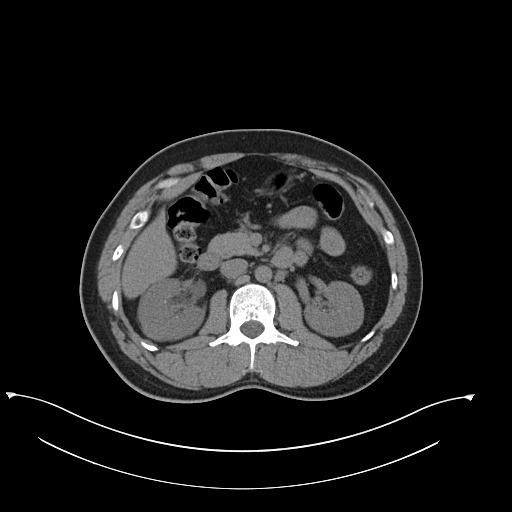
[im 75/93  soft-tissue]
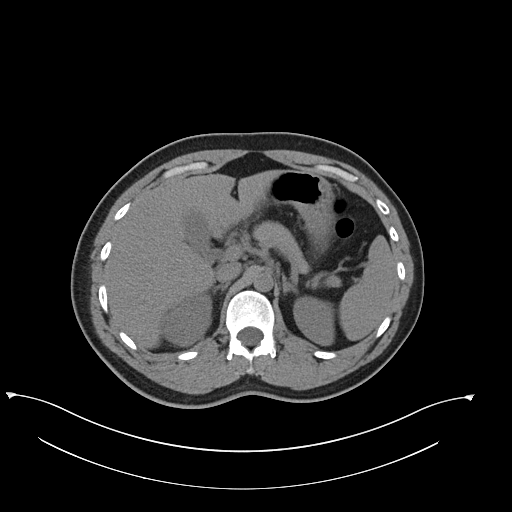
[im 79/93  soft-tissue]
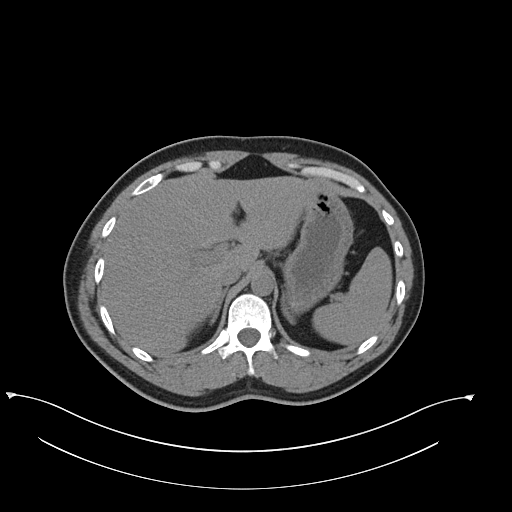
[im 88/93  soft-tissue]
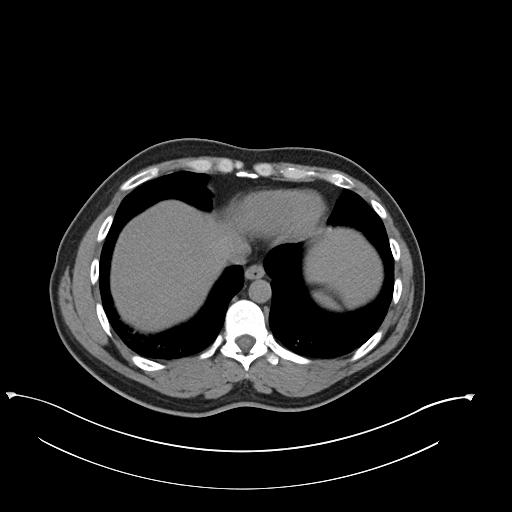

[Series 4: coronal · coronal · 0.77mm/px · 3 of 149 slices shown]
[im 50/149  soft-tissue]
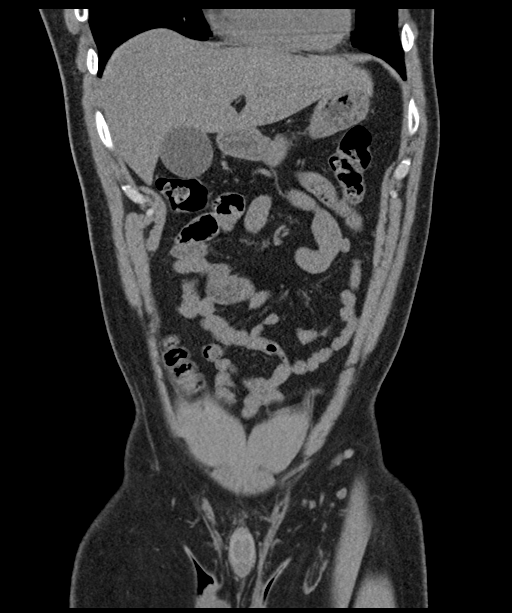
[im 66/149  soft-tissue]
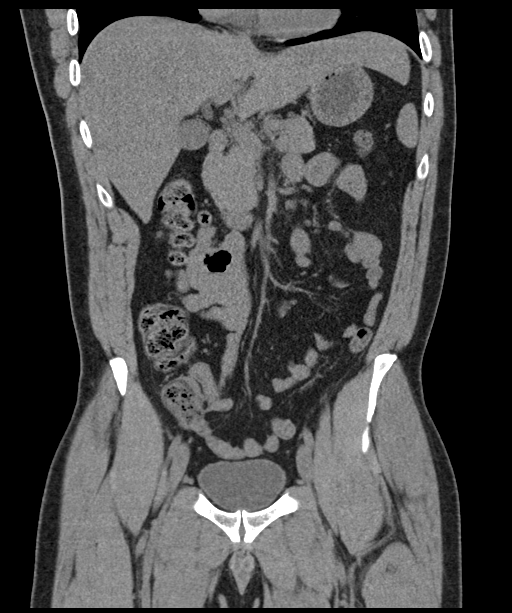
[im 83/149  soft-tissue]
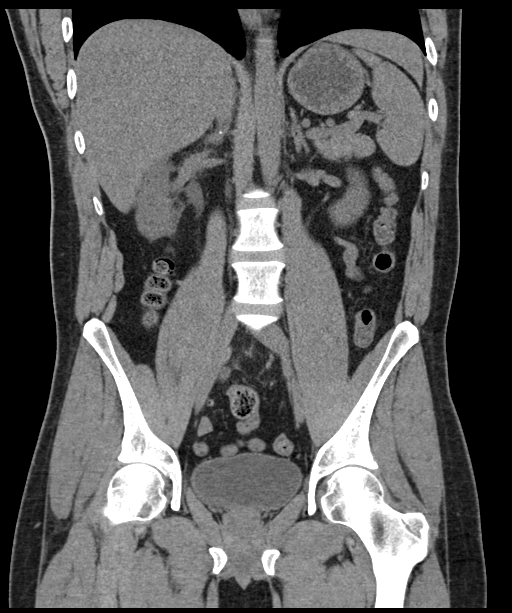

[16 of 46 positions shown; findings below may reference images not displayed]

FINDINGS: Lower chest: Atelectasis in the lung bases.

Hepatobiliary: No focal liver abnormality is seen. No gallstones,
gallbladder wall thickening, or biliary dilatation.

Pancreas: Unremarkable. No pancreatic ductal dilatation or
surrounding inflammatory changes.

Spleen: Normal in size without focal abnormality.

Adrenals/Urinary Tract: No adrenal gland nodules. Bilateral
intrarenal stones. Right hydronephrosis and hydroureter with
stranding around the right kidney and ureter. Stone in the orifice
of the right ureterovesical junction has progressed slightly since
previous study. Stone measures 5 mm in diameter. No hydronephrosis
or hydroureter on the left. Bladder wall is not thickened.

Stomach/Bowel: Stomach is within normal limits. Appendix appears
normal. No evidence of bowel wall thickening, distention, or
inflammatory changes.

Vascular/Lymphatic: No significant vascular findings are present. No
enlarged abdominal or pelvic lymph nodes.

Reproductive: Prostate is unremarkable.

Other: No abdominal wall hernia or abnormality. No abdominopelvic
ascites.

Musculoskeletal: No acute or significant osseous findings.
IMPRESSION: 5 mm stone in the orifice of the right ureterovesical junction with
moderate proximal obstruction. Bilateral nonobstructing intrarenal
stones.
